# Patient Record
Sex: Female | Born: 1965 | Race: Black or African American | Hispanic: No | Marital: Single | Smoking: Current every day smoker
Health system: Southern US, Community
[De-identification: ages and names within clinical notes are randomized; demographics above are authoritative.]

---

## 2001-01-17 ENCOUNTER — Inpatient Hospital Stay (HOSPITAL_COMMUNITY): Admission: AD | Admit: 2001-01-17 | Discharge: 2001-01-19 | Payer: Self-pay | Admitting: Obstetrics and Gynecology

## 2002-08-22 ENCOUNTER — Inpatient Hospital Stay (HOSPITAL_COMMUNITY): Admission: AD | Admit: 2002-08-22 | Discharge: 2002-08-25 | Payer: Self-pay | Admitting: Obstetrics and Gynecology

## 2002-08-24 ENCOUNTER — Encounter: Payer: Self-pay | Admitting: *Deleted

## 2002-09-28 ENCOUNTER — Inpatient Hospital Stay (HOSPITAL_COMMUNITY): Admission: AD | Admit: 2002-09-28 | Discharge: 2002-09-30 | Payer: Self-pay | Admitting: *Deleted

## 2003-03-24 ENCOUNTER — Encounter: Payer: Self-pay | Admitting: Emergency Medicine

## 2003-03-24 ENCOUNTER — Emergency Department (HOSPITAL_COMMUNITY): Admission: EM | Admit: 2003-03-24 | Discharge: 2003-03-24 | Payer: Self-pay | Admitting: Emergency Medicine

## 2004-08-17 ENCOUNTER — Inpatient Hospital Stay (HOSPITAL_COMMUNITY): Admission: RE | Admit: 2004-08-17 | Discharge: 2004-08-19 | Payer: Self-pay | Admitting: *Deleted

## 2006-04-20 ENCOUNTER — Inpatient Hospital Stay (HOSPITAL_COMMUNITY): Admission: AD | Admit: 2006-04-20 | Discharge: 2006-04-23 | Payer: Self-pay | Admitting: Specialist

## 2007-11-02 ENCOUNTER — Inpatient Hospital Stay (HOSPITAL_COMMUNITY): Admission: AD | Admit: 2007-11-02 | Discharge: 2007-11-03 | Payer: Self-pay | Admitting: Obstetrics and Gynecology

## 2007-11-02 ENCOUNTER — Encounter: Payer: Self-pay | Admitting: Obstetrics and Gynecology

## 2007-11-02 ENCOUNTER — Ambulatory Visit: Payer: Self-pay | Admitting: *Deleted

## 2010-11-11 ENCOUNTER — Emergency Department (HOSPITAL_COMMUNITY)
Admission: EM | Admit: 2010-11-11 | Discharge: 2010-11-11 | Disposition: A | Discharge status: Home / Self Care | Payer: No Typology Code available for payment source | Attending: Emergency Medicine | Admitting: Emergency Medicine

## 2010-11-11 ENCOUNTER — Emergency Department (HOSPITAL_COMMUNITY): Payer: No Typology Code available for payment source

## 2010-11-11 DIAGNOSIS — M542 Cervicalgia: Secondary | ICD-10-CM | POA: Insufficient documentation

## 2010-11-11 DIAGNOSIS — IMO0002 Reserved for concepts with insufficient information to code with codable children: Secondary | ICD-10-CM | POA: Insufficient documentation

## 2011-02-02 NOTE — Discharge Summary (Signed)
NAME:  Rhonda Hansen, Rhonda Hansen                       ACCOUNT NO.:  [id number]   MEDICAL RECORD NO.:  [id number]                   PATIENT TYPE:  INP   LOCATION:  A418                                 FACILITY:  [facility]   PHYSICIAN:  Langley Gauss, M.D.                DATE OF BIRTH:  12/15/1965   DATE OF ADMISSION:  08/22/2002  DATE OF DISCHARGE:  08/25/2002                                 DISCHARGE SUMMARY   DIAGNOSES:  1. Inadequate prenatal care with no prenatal visits to date.  2. Premature cervical dilatation at 32-1/[redacted] weeks gestation.  3. Substance abuse with positive drug screen for cocaine at time of     admission.   DISPOSITION:  The patient is to follow up in the office in one weeks' time  for continued prenatal care.   PERTINENT DISCHARGE MEDICATIONS:  The patient is given a prescription for  prenatal vitamins, likewise Hemocyte-F one p.o. q.d. for iron deficiency  anemia.   PERTINENT LABORATORY STUDIES:  Positive cocaine screen.  Hepatitis is  negative.  Rubella immune.  HIV is negative.   HOSPITAL COURSE:  This is a 45 year old patient who presents to [facility] complaining of abdominal pain.  She presented on August 22, 2002,  was essentially an OB unassigned patient.  The patient was noted to be  having very mild irregular uterine contractions with very few visible on the  monitor.  However, she was noted to have premature dilatation with cervix  known to be 2 cm dilated.  Thus, the patient was observed very carefully on  external fetal monitor observing for any presence of uterine activity.  No  significant uterine activity was encountered.  However, the patient was  admitted for careful observation.  The patient did receive two doses of 12  mg of IM betamethasone during his hospitalization to enhance fetal lung  maturity.  Likewise, she was treated with Unasyn 3 grams IV q.6 h. which  would also cover GC and Chlamydia if they were noted to be active  infections.  The patient did have one episode of nausea and vomiting which  was treated with 25 mg of IM Phenergan.  After initial evaluation on  August 22, 2002, the patient was continued on external fetal monitor.  She  did not reveal any presence of significant uterine activity.  The cervix was  reexamined and noted to be unchanged.  Thus, the patient was continued  during her hospitalization, OB ultrasound was obtained on August 24, 2002  with diagnoses of about 32-1/2 gestation and viable intrauterine pregnancy,  vertex presentation.  The patient's hospitalization was continued due to her  premature cervical dilatation.  On August 25, 2002, the patient did well  with modified bed rest.  She had increased her activity with no further  uterine contraction, no complaints of abdominal pain, cervix was again  examined and noted to be unchanged.  Thus, the  patient is discharged to home  on August 25, 2002.                                               Langley Gauss, M.D.    DC/MEDQ  D:  09/08/2002  T:  09/08/2002  Job:  [id number]

## 2011-02-02 NOTE — Op Note (Signed)
NAME:  Rhonda Hansen, Rhonda Hansen             ACCOUNT NO.:  [id number]   MEDICAL RECORD NO.:  [id number]          PATIENT TYPE:  INP   LOCATION:  A401                          FACILITY:  [facility]   PHYSICIAN:  Richardean Canal, M.D.  DATE OF BIRTH:  December 08, 1965   DATE OF PROCEDURE:  04/20/2006  DATE OF DISCHARGE:                                 OPERATIVE REPORT   DELIVERY NOTE:  Labor began at home at 0700 hours.  The patient arrived at 7  cm dilated in active labor.  The patient reached full dilatation at 1902 and  had a 42-minute second stage of labor.  The second stage was augmented with  Pitocin 2 mU per minute increased to 4 mU per minute to effect three-minute  contractions and facilitate pushing.  At 1944 the patient was delivered of a  normal-appearing female infant, Apgars 9 and 9.  Newborn was placed on the  mother's abdomen where the cord was clamped and divided.  The airway was  suctioned prior to first breath.  Vigorous drying was begun.  The newborn  was moved to the warmer.  Cord blood was collected.  The uterus was firmed  up with manual massage and the placenta delivered and inspected.  The  placenta appeared intact and normal.  Birth canal was inspected for  lacerations and none were found.  Estimated blood loss was less than 500 mL.  The patient tolerated the procedure well and remained in the birthing room  in satisfactory postpartum condition.           ______________________________  Richardean Canal, M.D.     RW/MEDQ  D:  04/20/2006  T:  04/21/2006  Job:  [id number]

## 2011-02-02 NOTE — Op Note (Signed)
NAME:  Rhonda Hansen, Rhonda Hansen                       ACCOUNT NO.:  [id number]   MEDICAL RECORD NO.:  [id number]                   PATIENT TYPE:  INP   LOCATION:  A417                                 FACILITY:  [facility]   PHYSICIAN:  Langley Gauss, M.D.                DATE OF BIRTH:  Jan 29, 1966   DATE OF PROCEDURE:  09/28/2002  DATE OF DISCHARGE:                                 OPERATIVE REPORT   DIAGNOSES:  1. 39-week intrauterine pregnancy for induction of labor.  2. History of substance abuse, specifically cocaine, during the pregnancy.   DELIVERY FORM:  Spontaneous assisted vaginal delivery of a 6 lb, 1 oz, female  infant, delivered over an intact perineum.  Delivery performed by Dr. Roylene Reason. Lisette Grinder.   ESTIMATED BLOOD LOSS:  Less than 500 cc.   COMPLICATIONS:  None.   SPECIMENS:  Arterial cord gas and cord blood to pathology and laboratory.  The placenta was examined and was noted to be apparently intact with a three-  vessel umbilical cord.   SUMMARY:  The patient is a 45 year old gravida 4, para 3, at [redacted] weeks  gestation and is admitted for induction.  Pertinently, the patient was  advised of the risks about cocaine use during the pregnancy.  A urine drug  screen at the time of admission for induction was noted to be negative.  On  initial examination, the patient was noted to be 3 cm dilated with a  reassuring fetal heart rate.  Amniotomy was performed with the findings of  clear amniotic fluid.  A fetal scalp electrode was placed, which documented  a reassuring fetal heart rate.  With the onset of discomfort and uterine  contractions, the patient requested epidural analgesia.  She was noted to  progress very rapidly __________ cm dilatation.  Examination at that time  revealed the apex to be well-applied to the __________ with excellent  descent with uterine contraction.  Thus, it was felt there was not adequate  time to place an epidural.  The patient was thus treated with IV  Nubain and  Phenergan during the course of labor.  She continued to progress very  rapidly.  She was placed in a dorsolithotomy position and prepped and draped  in the usual sterile manner.  The patient pushed well during a very short  second-stage labor.  The infant delivered in a direct OA position over the  intact perineum.  The mouth and nares were bulb suctioned of clear amniotic  fluid, and with expulsive efforts resulted spontaneous rotation to a left  anterior shoulder position.  Then, gentle downward traction combined with  good expulsive efforts resulted in the delivery of this infant beneath the  pubis symphysis without difficulty.  Spontaneous vigorous breathe and cry  was noted.  The umbilical cord was then milked toward the infant.  The cord  was doubly clamped and cut, and the infant was managed  by the nursing staff.  Arterial cord gas and cord blood were then obtained.  Gentle traction on the  umbilical cord resulted in separation which upon examination what appears to  be an intact placenta  with an attached three-vessel umbilical cord.  Excellent uterine tone was  achieved following delivery with IV Pitocin solution.  Examination of the  genital tract revealed no laceration.  Both mother and infant were doing  well following delivery.                                               Langley Gauss, M.D.    DC/MEDQ  D:  09/29/2002  T:  09/30/2002  Job:  [id number]

## 2011-02-02 NOTE — Discharge Summary (Signed)
NAME:  Elden, Signe             ACCOUNT NO.:  [id number]   MEDICAL RECORD NO.:  [id number]          PATIENT TYPE:  INP   LOCATION:  A401                          FACILITY:  [facility]   PHYSICIAN:  Tilda Burrow, M.D. DATE OF BIRTH:  Feb 01, 1966   DATE OF ADMISSION:  04/20/2006  DATE OF DISCHARGE:  LH                                 DISCHARGE SUMMARY   I came to labor and delivery to see Ms. Gadway twice today, however on  both occasions she was out smoking, however she would be gone for 45  minutes to an hour each time with no explanation for her absence.  [employer] came and took custody of her baby this  afternoon, and she left against medical advice.  The nurse, Donzetta Sprung,  saw her leaving on the security cameras, and just prior to that had told her  that I was here to see her to discharge her, and she actually went out to  talk with Ms. Karl Ito, however, Ms. Salinas said she was not coming back  into the birthing center, and left so no formal discharge was done.  Ms.  Broadus John told her to keep her follow up appointments as made, and she  verbalized an understanding.      Jacklyn Shell, C.N.M.      Tilda Burrow, M.D.  Electronically Signed    FC/MEDQ  D:  04/23/2006  T:  04/24/2006  Job:  [id number]   cc:   [facility]

## 2011-02-02 NOTE — H&P (Signed)
NAME:  Rhonda Hansen, Rhonda Hansen                       ACCOUNT NO.:  [id number]   MEDICAL RECORD NO.:  [id number]                   PATIENT TYPE:  INP   LOCATION:  A417                                 FACILITY:  [facility]   PHYSICIAN:  Langley Gauss, M.D.                DATE OF BIRTH:  04-Feb-1966   DATE OF ADMISSION:  09/28/2002  DATE OF DISCHARGE:                                HISTORY & PHYSICAL   HISTORY OF PRESENT ILLNESS:  This is a 45 year old gravida 4, para 3 at [redacted]  weeks gestation who is admitted for induction of labor.  The patient's  prenatal course has been complicated by very late presentation for OB care.  The patient presented to [facility] at 32-1/[redacted] weeks gestation with  premature cervical dilatation, premature labor, and noted to be positive  cocaine screen at that time.  The patient was admitted and treated with IV  fluids.  Also received empiric Unasyn 3 g Iv q.6h.  Received two injections  of IM betamethasone to enhance fetal lung maturity.  OB ultrasound performed  at Department of Radiology revealed a 32-1/2 week intrauterine pregnancy  with vertex presentation and normal anatomic survey as best could be  ascertained at [redacted] weeks gestation.  The patient did well at bed rest during  this hospitalization.  She did not require any other tocolytic therapy and  was discharged to home after a three day hospital stay.  The patient chose  to continue prenatal care through our office.  She has been followed since  that point in time, been very aggressive in our discussion with her of the  importance of cessation of cocaine use of the pregnancy and she is very  aware of the detrimental effects that this can have on the pregnancy, most  pertinently at that time of scheduling for the induction of labor patient is  advised that a urine drug screen will be performed upon admission on that  date and induction performed only if urine drug screen is negative.  The  patient has had  several nonstress tests which have been reactive.   PAST MEDICAL HISTORY:  She does have three prior vaginal deliveries without  complications, 45 years old, 45 years old, and 18 months.  Each of these  weighed 5+ pounds.   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  We did not have an  opportunity to perform a hemoglobin electrophoresis due to patient's  sporadic prenatal care.  However, she has no indication that she does have  sickle cell disease, thus most likely she is a carrier only and this can be  evaluated on review of her previous medical record.   ALLERGIES:  The patient has no known drug allergies.   CURRENT MEDICATIONS:  Prenatal vitamins.   SOCIAL HISTORY:  Smokes five to six cigarettes per day.  Hopefully negative  cocaine.   PHYSICAL EXAMINATION:  GENERAL:  Black female.  VITAL SIGNS:  Height 5 feet 4 inches, prepregnancy weight 110, most recent  weight 132, blood pressure 136/74, pulse rate 80, respiratory rate 20.  HEENT:  Negative.  No adenopathy.  NECK:  Supple.  Thyroid is nonpalpable.  Mucous membranes are moist.  LUNGS:  Clear.  CARDIOVASCULAR:  Regular rate and rhythm.  ABDOMEN:  Soft and nontender.  No surgical scars are identified.  The  patient is vertex presentation by Leopold's maneuvers.  Fundal height is  noted to be 36 cm.  EXTREMITIES:  Normal.  PELVIC:  Normal external genitalia.  No lesions or ulcerations identified.  No leakage of fluid or vaginal bleeding.  Cervix noted to be 3 cm dilated,  70% effaced, -1 station with the vertex well applied to the cervix.  External fetal monitor reveals a reassuring fetal heart rate with the fetal  heart rate baseline of 140-150.  Fetal heart rate accelerations are noted,  but no fetal heart rate decelerations.   ASSESSMENT:  A 39 week intrauterine pregnancy, history of cocaine use during  this pregnancy with very late prenatal care.  Urine drug screen obtained at  time of admission is negative  for cocaine.   PLAN:  Proceed with an amniotomy and assess uterine contraction pattern,  thereafter to augment or induce with Pitocin as clinically indicated.  The  patient does plan on bottle feeding.                                               Langley Gauss, M.D.    DC/MEDQ  D:  09/28/2002  T:  09/28/2002  Job:  [id number]

## 2011-02-02 NOTE — Group Therapy Note (Signed)
NAME:  Rhonda Hansen, Rhonda Hansen             ACCOUNT NO.:  [id number]   MEDICAL RECORD NO.:  [id number]          PATIENT TYPE:  INP   LOCATION:  LDR1                          FACILITY:  [facility]   PHYSICIAN:  Lazaro Arms, M.D.   DATE OF BIRTH:  December 07, 1965   DATE OF PROCEDURE:  DATE OF DISCHARGE:                                   PROGRESS NOTE   DELIVERY SUMMARY:   ONSET OF LABOR:  August 16, 2004 at 8:30 p.m.   DATE OF DELIVERY:  August 17, 2004 at 2:33 a.m.   LENGTH OF FIRST STAGE LABOR:  Four hours and 29 minutes.   LENGTH OF SECOND STAGE LABOR:  33 minutes.   LENGTH OF THIRD STAGE LABOR:  7 minutes.   Hannie had a vacuum-assisted delivery.  Kiwi vacuum was placed on the  presenting head at a crowning station due to maternal inability to push.  Kiwi suction was applied to the green marker and assisted times one  contraction.  Head was easily delivered.  Mucosal thickening was noted.  On  delivery of head, infant was thoroughly suctioned with the DeLee suction on  the perineum and then a shoulder cord was noted, but infant spontaneously  delivered.  Upon delivery of infant, infant was again DeLee suctioned very  thoroughly; 6 cc of yellowish meconium was obtained in the DeLee.  Infant  had spontaneous cry, good movement, pinked up well.  Cord was clamped and  cut and passed off to the nursery staff for care.  Apgar's were 9 and 9.  Third stage of labor was actively managed with 20 units Pitocin and 1000 cc  of LR at a rapid rate.  Placenta was delivered spontaneously via Schultz's  mechanism.  A three-vessel cord was noted upon inspection.  Membranes were  noted to be intact, but stained yellow with meconium.  Placenta was sent to  pathology.  Infant and mother were stabilized, transferred out to the  postpartum unit in stable condition.     ____________________  Lazaro Arms, M.D.     Darl   DL/MEDQ  D:  57/84/6962  T:  08/17/2004  Job:  [id number]   cc:   Francoise Schaumann. Halm,  D.O.  [address], [address]  [place]  [place] [address]  Fax: [phone]   [facility]

## 2011-02-02 NOTE — Discharge Summary (Signed)
   NAME:  Rhonda Hansen, Rhonda Hansen                       ACCOUNT NO.:  [id number]   MEDICAL RECORD NO.:  [id number]                   PATIENT TYPE:  INP   LOCATION:  A417                                 FACILITY:  [facility]   PHYSICIAN:  Langley Gauss, M.D.                DATE OF BIRTH:  02/19/1966   DATE OF ADMISSION:  09/28/2002  DATE OF DISCHARGE:  09/30/2002                                 DISCHARGE SUMMARY   DISCHARGE DIAGNOSES:  1. A 39 week intrauterine pregnancy for induction of labor.  2. History of substance abuse, specifically cocaine use during this     pregnancy.  3. Insufficient prenatal care with very late presentation to labor and     delivery as an unassigned obstetrical patient.   HISTORY OF PRESENT ILLNESS:  Delivery performed:  A spontaneous assisted  vaginal delivery of a 6 pound 1 ounce female infant delivered over an intact  perineum.  Apparently an infant circumcision was not performed.  No epidural  was performed.   LABORATORY DATA:  Urine drug screen was negative upon admission.  The  patient was noted to have a positive cocaine toxicology on 08/22/02.  O  positive blood type.  RPR was nonreactive.  Hemoglobin 10.7, hematocrit  31.4, with a white count of 6.0.   HOSPITAL COURSE:  See previous dictation.  The patient was admitted on  09/28/02.  Amniotomy performed.  Clear amniotic fluid was noted.  Fetal scalp  electrode documented reassuring fetal heart rate.  The patient received only  IV Nubain during the course of labor.  There was insufficient time to place  an epidural secondary to a rapid progress of labor and delivery.  Thus the  patient progressed rapidly to a well controlled atraumatic vaginal delivery.  Post partum the patient did very well.  She bonded well with the infant.  She had good support from friends.  She remained afebrile and had no  excessive vaginal bleeding.  Thus the patient and the infant were discharged  to home on post partum day #1-1/2.  As  stated previously the patient did not  meet her financial obligation for circumcision, thus it was not performed at  the time of discharge.  The patient is notified that this can be performed  on an outpatient basis in the office if she desires.                                               Langley Gauss, M.D.    DC/MEDQ  D:  10/06/2002  T:  10/06/2002  Job:  [id number]

## 2011-02-02 NOTE — H&P (Signed)
NAME:  Rhonda Hansen, [name]                       ACCOUNT NO.:  [id number]   MEDICAL RECORD NO.:  [id number]                   PATIENT TYPE:  INP   LOCATION:  A418                                 FACILITY:  [facility]   PHYSICIAN:  Langley Gauss, M.D.                DATE OF BIRTH:  1966-03-28   DATE OF ADMISSION:  08/22/2002  DATE OF DISCHARGE:                                HISTORY & PHYSICAL   SUBJECTIVE:  The patient states significant relief from pelvic pressure.  She denies any uterine contractions or menstrual-type cramps, likewise she  denies any intermittent back pain.  She specifically denies any vaginal  discharge, leakage of fluid, or any vaginal bleeding.  In addition, mucousy  discharge has not increased.   OBJECTIVE:  GENERAL:  The patient appears in no acute distress.  Respiratory  symptoms are markedly improved as the patient is not noted to have the  sporadic coughing episodes.  VITAL SIGNS:  Blood pressure noted to be 120-130/80-100, respiratory rate  stable at 20-24, and pulse less than 100.   PHYSICAL EXAMINATION:  GENERAL:  Noted to be in no acute distress.  Ambulatory, cooperative.  LUNGS:  Clear to auscultation.  CARDIOVASCULAR:  Reveals regular rate and rhythm.  PELVIC:  The uterus remains soft and nontender with fundal height of 29 cm  and continues to be vertex presentation.  EXTREMITIES:  Normal.  PELVIC:  There is noted to be no leakage fluid or vaginal bleeding.   LABORATORY DATA:  Laboratories discussed with the patient to include the  positive cocaine, which she is strongly advised has deleterious effects on  the pregnancy, to consist of hypertensive crisis, possible stroke, also as  an etiology of her preterm labor and premature cervical dilatation.  She is  likewise advised that this will compromise fetal growth and development.  Nonstress test performed on August 23, 2002, reveals fetal heart rate 150,  accelerations noted greater than 15 beats per  minute times greater than 15  second duration, no fetal heart rate decelerations are noted, long-term  variability is noted to be normal.  External fetal monitor interpreted at  this time now reveals the complete absence of any significant uterine  activity.   ASSESSMENT:  Best gestational age of about [redacted] weeks gestation with no  prenatal care, presenting with premature cervical dilatation and substance  abuse problem with positive cocaine on urine drug screen.  The patient did  not require any aggressive tocolysis as no significant uterine contractions  were identified by history or by external fetal monitor.   PLAN:  The patient is currently to be treated with betamethasone x2 to  enhance fetal lung maturity.  In addition, she is achieving IV Unasyn  empirically for the upper respiratory infection with productive cough.  In  addition, this will provide any coverage for her increased risk of group B  streptococcus carrier status.  The patient will be  confined to modified  bedrest.  Foley catheter is currently in place to document the adequacy of  urine output.  The patient will be continued during this hospitalization and  frequently reassessed for any preterm labor.  An OB ultrasound will be  performed in the Department of Radiology on August 24, 2002, to perform an  anatomic survey.   ASSESSMENT:  1. On August 23, 2002, nonstress test interpretation, 32 weeks premature     cervical dilatation.  2. Pelvic pain.  3. Substance abuse, cocaine.                                               Langley Gauss, M.D.    DC/MEDQ  D:  08/24/2002  T:  08/24/2002  Job:  [id number]

## 2011-02-02 NOTE — H&P (Signed)
NAME:  Rhonda Hansen, Rhonda Hansen             ACCOUNT NO.:  [id number]   MEDICAL RECORD NO.:  [id number]          PATIENT TYPE:  INP   LOCATION:  LDR1                          FACILITY:  [facility]   PHYSICIAN:  Richardean Canal, M.D.  DATE OF BIRTH:  [birth year]   DATE OF ADMISSION:  04/20/2006  DATE OF DISCHARGE:  LH                                HISTORY & PHYSICAL   HISTORY:  This is a 45 year old black female, gravida 6, para 5, with  unknown EDC, who came in with no prenatal care in labor.  The history is  sketchy.  The patient gives a history of drug use, but states none in the  past week or two.  The monitor strip shows reduced variability suggesting  the possibility of more recent drug use.  A drug screen has been drawn.  The  patient was evaluated on admission and found to be 7 cm, in active labor.  The patient is unknown Group B Strep status.   PAST MEDICAL HISTORY:  Negative.   PAST SURGICAL HISTORY:  None.   ALLERGIES:  No known drug allergies.   REGULAR MEDICATIONS:  None.   PHYSICAL EXAMINATION:  GENERAL:  The patient appears as a well-developed,  well-nourished, thin, term or near-term black female in advanced labor.  HEART:  Normal sinus rhythm.  No murmurs are heard.  LUNGS:  Clear.  ABDOMEN:  The fetus is extending vertex with the head deeply engaged.  There  are no surgical scars on the abdomen.  The uterus is soft and nontender  between contractions.  The fundus appears small term size.  EXTREMITIES:  There is no peripheral edema.  Reflexes are 1+.  PELVIC:  The cervix is completely dilated with membranes intact and the  fetus presenting as an LOA at +3 station.   IMPRESSION:  1.  Intrauterine gestation at or near term.  2.  Active labor.  3.  No prenatal care  4.  Unknown Group B Strep status.   DISPOSITION:  An IV has been started and the patient is receiving  ampicillin.  Membranes have been ruptured artificially with a slight tinge  of meconium, but basically clear  fluid.  Management is expectant for vaginal  delivery.                                            ______________________________  Richardean Canal, M.D.     RW/MEDQ  D:  04/20/2006  T:  04/20/2006  Job:  [id number]

## 2011-02-02 NOTE — H&P (Signed)
NAME:  Rhonda Hansen, Rhonda Hansen                       ACCOUNT NO.:  [id number]   MEDICAL RECORD NO.:  [id number]                   PATIENT TYPE:  INP   LOCATION:  A418                                 FACILITY:  [facility]   PHYSICIAN:  Langley Gauss, M.D.                DATE OF BIRTH:  1965/10/26   DATE OF ADMISSION:  08/22/2002  DATE OF DISCHARGE:                                HISTORY & PHYSICAL   HISTORY OF PRESENT ILLNESS:  This is a 45 year old gravida 4, para 3, with  an unknown last menstrual period and no prenatal care to date, who presents  to [facility] the late p.m. of 08/22/02 with the chief complaint of  vomiting x3 days duration, unable to tolerate any p.o. intake, and some  pelvic pressure and pain.  The patient also complains of a nonproductive  cough of several days duration.  Pertinently, the patient states that she  has had the onset of uterine cramps the last two evenings.  She states the  cramps intensified in the nighttime as they currently are.  The patient  does, however, provide history that the only reason she came today, rather  than 24 or 48 hours previously, was due to transportation.  The patient  denies any vaginal bleeding or leakage of fluid.   PAST OBSTETRIC HISTORY:  The patient has three prior vaginal deliveries at  term without complications, by her report.  Most recently she delivered May  2003.   ALLERGIES:  She states no known drug allergies.   CURRENT MEDICATIONS:  None.   The patient did deny substance use during the pregnancy.   PHYSICAL EXAMINATION:  Height and weight are unknown.  GENERAL:  The patient appears to be in mild distress, complaining of pelvic  pressure, and also is noted to have very frequent, nonproductive, raspy  cough.  VITAL SIGNS:  Blood pressure 137/100, pulse rate of 68, respiratory rate is  20, temperature 97.8.  HEENT:  Reveals slightly icteric sclerae.  NECK:  Supple.  NEURO:  Cranial nerves are intact.  LUNGS:  Clear to auscultation.  CARDIOVASCULAR:  Regular rate and rhythm, no murmurs identified.  ABDOMEN:  Uterus noted to be soft, nontender.  No surgical scars are  identified.  Fundal height was measured by the nursing staff and the report  was that it was 23-24 cm.  Fundal height, performed by myself, revealed a  fundal height of 29 cm.  She is noted to be vertex presentation by Leopold's  maneuvers.  EXTREMITIES:  Noted to be normal.  PELVIC:  Reveals normal external genitalia.  No lesions or ulcerations  identified.  No vaginal bleeding.  No leakage of fluid.  Digital examination  reveals the cervix to be 2 cm dilated, 70% effaced.  Vertex at a -1 station  and easily palpable, noted to be still posterior.   STUDIES:  External fetal monitor and stress test interpretation:  The  external fetal monitor reveals a fetal heart rate baseline of 150.  There  are accelerations noted at greater than 15 beats per minute times greater  than 15 second duration.  No fetal heart rate decelerations are noted.  Long-  term variability is noted to be normal.  Toco externally is noninterpretable  secondary to the patient's frequent episodes of nonproductive coughing.  Nonstress test interpretation:  Reactive.  A limited OB ultrasound performed  by Dr. Roylene Reason. Lisette Grinder in labor and delivery reveals a single intrauterine  pregnancy, vertex presentation, subjectively a normal amniotic fluid volume  is noted, fetal cardiac activity is identified within the 150s.  Fetal  movement is likewise noted.  The placenta is noted to be not low lying.  Multiple parameters and views performed of the BPD and femur length reveals  parameters consistent with 32-[redacted] weeks gestation.  Anatomic survey is not  performed.   LABORATORY DATA:  Pertinent laboratory studies obtained in this patient with  no prenatal care reveals evidence of iron-deficiency anemia with a  hemoglobin of 9.9, hematocrit 29.1, platelet count is  220,000.  Electrolytes  reveal electrolyte disturbance with hypokalemia with a potassium of 3.3.  Urinalysis is pertinent for greater than 80 ketones, small esterase present,  few bacteria present, few epithelial cells present.  O-positive blood type.  RPR, hepatitis, and HIV currently pending.  Urinalysis is also pertinent for  a urine drug screen, done according to protocol, for any patient presenting  with pre-term labor or no prenatal care.  The urine drug screen is positive  for cocaine.   ASSESSMENT:  1. Unknown gestational age, multiple parameters will place the patient at 53-     [redacted] weeks gestation, per ultrasound by Dr. Lisette Grinder.  Vertex presentation.  2. Positive cocaine on the urine drug screen which is discussed with the     patient, though she denies it to be an ongoing active problem.  3. No prenatal care.  All prenatal laboratory studies to be obtained during     this hospitalization.  4. Iron-deficiency anemia.  Hopefully, this will be corrected with the     initiation of prenatal vitamins and iron therapy.  5. Hypokalemia.  The patient will receive aggressive fluid hydration during     this hospitalization, which in addition, should result in clearing of her     ketonuria.  6. Premature dilatation with cervix noted to be 2 cm dilated in this     multiparous patient, unable to interpret at this time whether uterine     contractions are responsible for this dilatation.  7. Upper respectively infection.  Patient with nonproductive hacking cough     and is a cigarette smoker with presumed bronchitis.  She will be treated     with IV Unasyn during this hospitalization, as well as Tussionex as a     cough suppressant and expectorant.  Hopefully, this will result in     decreased coughing episodes which will result in being able to ascertain     the presence or absence of significant uterine activity, in addition,    serial pelvic examinations will be required to assess for any  cervical     change.  The patient is to be aggressively tocolysed should cervical     change for uterine contractions be identified.  The patient will be     treated with betamethasone 12 mg IM which can be repeated in 24 hours     duration  to enhance fetal lung maturity due to her premature dilatation.                                               Langley Gauss, M.D.    DC/MEDQ  D:  08/24/2002  T:  08/24/2002  Job:  [id number]

## 2011-02-02 NOTE — H&P (Signed)
NAME:  Maring, Lorre             ACCOUNT NO.:  [id number]   MEDICAL RECORD NO.:  [id number]          PATIENT TYPE:  OIB   LOCATION:  LDR1                          FACILITY:  [facility]   PHYSICIAN:  Lazaro Arms, M.D.   DATE OF BIRTH:  Feb 21, 1966   DATE OF ADMISSION:  08/17/2004  DATE OF DISCHARGE:  LH                                HISTORY & PHYSICAL   REASON FOR ADMISSION:  Pregnancy at approximately 34 to 35 weeks, active  labor.   HISTORY OF PRESENT ILLNESS:  Ms. Alvidrez states that she started having  contractions at approximately 8:30 p.m. tonight.  She has not had any  prenatal care.  This is her fifth pregnancy, fifth baby.  She is a gravida  5, para 4.  Admitted in active labor.  Also, she states she does drink.  She  also uses crack cocaine.  The last time she used was approximately 4-5 days  ago.   MEDICAL HISTORY:  Negative.   SURGICAL HISTORY:  Negative.   No prenatal care.   PHYSICAL EXAMINATION:  VITAL SIGNS:  Stable.  ABDOMEN:  Fundal height is 34-35 cm.  PELVIC:  Cervix presentation is noted.  Cervix is 7 cm, 80% effaced, -1  station.  Fetal heart rate pattern is stable with accelerations noted.   PLAN:  We are going to admit.  Expect vaginal delivery.  Start ampicillin 2  gm IV for GBS prophylaxis due to unknown GBS status.  Also will get a social  service consult.     Darl   DL/MEDQ  D:  16/06/9603  T:  08/17/2004  Job:  [id number]   cc:   [facility]

## 2011-02-02 NOTE — Discharge Summary (Signed)
NAME:  Rhonda Hansen, Rhonda Hansen             ACCOUNT NO.:  [id number]   MEDICAL RECORD NO.:  [id number]          PATIENT TYPE:  INP   LOCATION:  A403                          FACILITY:  [facility]   PHYSICIAN:  Tilda Burrow, M.D. DATE OF BIRTH:  24-Aug-1966   DATE OF ADMISSION:  08/17/2004  DATE OF DISCHARGE:  12/03/2005LH                                 DISCHARGE SUMMARY   ADMISSION DIAGNOSES:  1.  Pregnancy at 34 to 35 weeks.  2.  Active labor.  3.  No prenatal care.  4.  History of cocaine use.   HISTORY:  History of diagnosed pregnancy at 54 to [redacted] weeks gestation,  delivered.  History of cocaine use.  Desire for elective permanent  sterilization in 30 days.   HOSPITAL COURSE:  Ms. Hoban was admitted as gravida 5, para 4, in active  labor with a history of alcohol and cocaine use during the pregnancy.  Urine  drug screen on admission is negative.  She is 7 cm, 80, -1, on admission.  With the patient begun on ampicillin due to unknown Group B Strep probe  status.   The patient progressed promptly within a couple of hours to delivery.  Delivering a healthy-appearing female infant, Apgar's 9 and 9, delivered by  Zerita Boers, N.M.  She was kept for two days and discharged on August 19, 2004.  The patient's urine drug screen as reported was negative.   LABORATORY DATA:  Additional labs include GC and Chlamydia negative.  Blood  type O positive. Hemoglobin and hematocrit 11 and 32.  HIV nonreactive.  Hepatitis B surface antigen negative.  Rubella immunity present.  Urine drug  screen showed no evidence of cocaine in her system on admission.  Postpartum  hematocrit was 30%.  She was stable for discharge.  Plans to sign tubal  papers in the office and proceed with permanent sterilization in 30 days.   DISCHARGE MEDICATIONS:  1.  Prenatal vitamins.  2.  Iron.   FOLLOWUP:  Will follow up in our office.     John   JVF/MEDQ  D:  08/19/2004  T:  08/19/2004  Job:  [id number]

## 2011-06-08 LAB — RAPID URINE DRUG SCREEN, HOSP PERFORMED
Barbiturates: NOT DETECTED
Opiates: NOT DETECTED

## 2011-06-08 LAB — STREP B DNA PROBE: Strep Group B Ag: NEGATIVE

## 2011-06-08 LAB — DIFFERENTIAL
Basophils Absolute: 0
Basophils Relative: 0
Eosinophils Absolute: 0.1
Monocytes Absolute: 0.8
Monocytes Relative: 7
Neutro Abs: 9.2 — ABNORMAL HIGH
Neutrophils Relative %: 78 — ABNORMAL HIGH

## 2011-06-08 LAB — TYPE AND SCREEN
ABO/RH(D): O POS
Antibody Screen: NEGATIVE

## 2011-06-08 LAB — ABO/RH: ABO/RH(D): O POS

## 2011-06-08 LAB — CBC
Hemoglobin: 10 — ABNORMAL LOW
MCHC: 35.1
MCV: 93.7
RBC: 3.05 — ABNORMAL LOW
RDW: 13.7

## 2011-06-08 LAB — RPR: RPR Ser Ql: NONREACTIVE

## 2014-03-04 ENCOUNTER — Encounter (HOSPITAL_COMMUNITY): Payer: Self-pay | Admitting: Emergency Medicine

## 2014-03-04 ENCOUNTER — Emergency Department (HOSPITAL_COMMUNITY)
Admission: EM | Admit: 2014-03-04 | Discharge: 2014-03-04 | Disposition: A | Discharge status: Home / Self Care | Payer: No Typology Code available for payment source | Attending: Emergency Medicine | Admitting: Emergency Medicine

## 2014-03-04 DIAGNOSIS — S1190XA Unspecified open wound of unspecified part of neck, initial encounter: Secondary | ICD-10-CM | POA: Insufficient documentation

## 2014-03-04 DIAGNOSIS — Z23 Encounter for immunization: Secondary | ICD-10-CM | POA: Insufficient documentation

## 2014-03-04 DIAGNOSIS — S1191XA Laceration without foreign body of unspecified part of neck, initial encounter: Secondary | ICD-10-CM

## 2014-03-04 DIAGNOSIS — F17200 Nicotine dependence, unspecified, uncomplicated: Secondary | ICD-10-CM | POA: Insufficient documentation

## 2014-03-04 MED ORDER — LIDOCAINE HCL (PF) 1 % IJ SOLN
5.0000 mL | Freq: Once | INTRAMUSCULAR | Status: DC
  Filled 2014-03-04: qty 5

## 2014-03-04 MED ORDER — TETANUS-DIPHTH-ACELL PERTUSSIS 5-2.5-18.5 LF-MCG/0.5 IM SUSP
0.5000 mL | Freq: Once | INTRAMUSCULAR | Status: AC
  Administered 2014-03-04: 0.5 mL via INTRAMUSCULAR
  Filled 2014-03-04: qty 0.5

## 2014-03-04 NOTE — Discharge Instructions (Signed)
Please be sure to monitor your condition carefully.  Return here for concerning changes in your condition.  Sutures need to be removed in 7 days.
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
## 2014-03-04 NOTE — ED Notes (Signed)
EDP in with suture cart
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
## 2014-07-19 ENCOUNTER — Encounter (HOSPITAL_COMMUNITY): Payer: Self-pay | Admitting: Emergency Medicine

## 2017-01-30 ENCOUNTER — Encounter (HOSPITAL_COMMUNITY): Payer: Self-pay | Admitting: *Deleted

## 2017-01-30 ENCOUNTER — Observation Stay (HOSPITAL_COMMUNITY)
Admission: EM | Admit: 2017-01-30 | Discharge: 2017-01-31 | Disposition: A | Discharge status: Home / Self Care | Payer: Self-pay | Attending: Family Medicine | Admitting: Family Medicine

## 2017-01-30 ENCOUNTER — Emergency Department (HOSPITAL_COMMUNITY): Payer: Self-pay

## 2017-01-30 DIAGNOSIS — S51012A Laceration without foreign body of left elbow, initial encounter: Secondary | ICD-10-CM | POA: Insufficient documentation

## 2017-01-30 DIAGNOSIS — Y939 Activity, unspecified: Secondary | ICD-10-CM | POA: Insufficient documentation

## 2017-01-30 DIAGNOSIS — R42 Dizziness and giddiness: Secondary | ICD-10-CM

## 2017-01-30 DIAGNOSIS — Y999 Unspecified external cause status: Secondary | ICD-10-CM | POA: Insufficient documentation

## 2017-01-30 DIAGNOSIS — R2689 Other abnormalities of gait and mobility: Secondary | ICD-10-CM | POA: Insufficient documentation

## 2017-01-30 DIAGNOSIS — Z5181 Encounter for therapeutic drug level monitoring: Secondary | ICD-10-CM | POA: Insufficient documentation

## 2017-01-30 DIAGNOSIS — S1191XA Laceration without foreign body of unspecified part of neck, initial encounter: Secondary | ICD-10-CM | POA: Insufficient documentation

## 2017-01-30 DIAGNOSIS — S0101XA Laceration without foreign body of scalp, initial encounter: Principal | ICD-10-CM | POA: Insufficient documentation

## 2017-01-30 DIAGNOSIS — R2681 Unsteadiness on feet: Secondary | ICD-10-CM

## 2017-01-30 DIAGNOSIS — F17210 Nicotine dependence, cigarettes, uncomplicated: Secondary | ICD-10-CM | POA: Insufficient documentation

## 2017-01-30 DIAGNOSIS — Y929 Unspecified place or not applicable: Secondary | ICD-10-CM | POA: Insufficient documentation

## 2017-01-30 DIAGNOSIS — S21212A Laceration without foreign body of left back wall of thorax without penetration into thoracic cavity, initial encounter: Secondary | ICD-10-CM | POA: Insufficient documentation

## 2017-01-30 DIAGNOSIS — S0990XA Unspecified injury of head, initial encounter: Secondary | ICD-10-CM

## 2017-01-30 DIAGNOSIS — S41012A Laceration without foreign body of left shoulder, initial encounter: Secondary | ICD-10-CM | POA: Insufficient documentation

## 2017-01-30 LAB — RAPID URINE DRUG SCREEN, HOSP PERFORMED
AMPHETAMINES: NOT DETECTED
BARBITURATES: NOT DETECTED
Benzodiazepines: NOT DETECTED
Cocaine: POSITIVE — AB
Opiates: POSITIVE — AB
TETRAHYDROCANNABINOL: NOT DETECTED

## 2017-01-30 LAB — CBC WITH DIFFERENTIAL/PLATELET
Basophils Absolute: 0.1 10*3/uL (ref 0.0–0.1)
Basophils Relative: 1 %
Eosinophils Absolute: 0 10*3/uL (ref 0.0–0.7)
Eosinophils Relative: 1 %
HEMATOCRIT: 33.3 % — AB (ref 36.0–46.0)
Hemoglobin: 11.6 g/dL — ABNORMAL LOW (ref 12.0–15.0)
LYMPHS PCT: 24 %
Lymphs Abs: 1.5 10*3/uL (ref 0.7–4.0)
MCH: 32.5 pg (ref 26.0–34.0)
MCHC: 34.8 g/dL (ref 30.0–36.0)
MCV: 93.3 fL (ref 78.0–100.0)
MONO ABS: 0.5 10*3/uL (ref 0.1–1.0)
MONOS PCT: 8 %
NEUTROS ABS: 4.2 10*3/uL (ref 1.7–7.7)
Neutrophils Relative %: 66 %
Platelets: 231 10*3/uL (ref 150–400)
RBC: 3.57 MIL/uL — ABNORMAL LOW (ref 3.87–5.11)
RDW: 13.2 % (ref 11.5–15.5)
WBC: 6.4 10*3/uL (ref 4.0–10.5)

## 2017-01-30 LAB — TYPE AND SCREEN
ABO/RH(D): O POS
Antibody Screen: NEGATIVE

## 2017-01-30 LAB — BASIC METABOLIC PANEL
ANION GAP: 11 (ref 5–15)
BUN: 11 mg/dL (ref 6–20)
CO2: 19 mmol/L — AB (ref 22–32)
Calcium: 8.3 mg/dL — ABNORMAL LOW (ref 8.9–10.3)
Chloride: 106 mmol/L (ref 101–111)
Creatinine, Ser: 0.87 mg/dL (ref 0.44–1.00)
GFR calc Af Amer: >60 mL/min (ref >60)
GFR calc non Af Amer: >60 mL/min (ref >60)
GLUCOSE: 164 mg/dL — AB (ref 65–99)
POTASSIUM: 3.2 mmol/L — AB (ref 3.5–5.1)
Sodium: 136 mmol/L (ref 135–145)

## 2017-01-30 LAB — I-STAT BETA HCG BLOOD, ED (MC, WL, AP ONLY): I-stat hCG, quantitative: <5 m[IU]/mL (ref <5)

## 2017-01-30 LAB — ETHANOL: Alcohol, Ethyl (B): <5 mg/dL (ref <5)

## 2017-01-30 LAB — CBG MONITORING, ED: Glucose-Capillary: 88 mg/dL (ref 65–99)

## 2017-01-30 LAB — HEMOGLOBIN AND HEMATOCRIT, BLOOD
HCT: 27.5 % — ABNORMAL LOW (ref 36.0–46.0)
Hemoglobin: 9.4 g/dL — ABNORMAL LOW (ref 12.0–15.0)

## 2017-01-30 MED ORDER — VITAMIN B-1 100 MG PO TABS
100.0000 mg | ORAL_TABLET | Freq: Every day | ORAL | Status: DC
  Administered 2017-01-30 – 2017-01-31 (×2): 100 mg via ORAL
  Filled 2017-01-30 (×2): qty 1

## 2017-01-30 MED ORDER — LORAZEPAM 1 MG PO TABS
0.0000 mg | ORAL_TABLET | Freq: Four times a day (QID) | ORAL | Status: DC
  Administered 2017-01-30 – 2017-01-31 (×2): 1 mg via ORAL
  Filled 2017-01-30 (×2): qty 1

## 2017-01-30 MED ORDER — VITAMIN B-1 100 MG PO TABS: 100.0000 mg | ORAL_TABLET | Freq: Every day | ORAL | Status: DC

## 2017-01-30 MED ORDER — HYDROCODONE-ACETAMINOPHEN 5-325 MG PO TABS
1.0000 | ORAL_TABLET | Freq: Once | ORAL | Status: AC
  Administered 2017-01-30: 1 via ORAL
  Filled 2017-01-30: qty 1

## 2017-01-30 MED ORDER — LORAZEPAM 2 MG/ML IJ SOLN: 1.0000 mg | Freq: Four times a day (QID) | INTRAMUSCULAR | Status: DC | PRN

## 2017-01-30 MED ORDER — TETANUS-DIPHTH-ACELL PERTUSSIS 5-2.5-18.5 LF-MCG/0.5 IM SUSP
0.5000 mL | Freq: Once | INTRAMUSCULAR | Status: AC
  Administered 2017-01-30: 0.5 mL via INTRAMUSCULAR
  Filled 2017-01-30: qty 0.5

## 2017-01-30 MED ORDER — LORAZEPAM 1 MG PO TABS
1.0000 mg | ORAL_TABLET | Freq: Once | ORAL | Status: AC
  Administered 2017-01-30: 1 mg via ORAL
  Filled 2017-01-30: qty 1

## 2017-01-30 MED ORDER — HYDROCODONE-ACETAMINOPHEN 5-325 MG PO TABS: 1.0000 | ORAL_TABLET | Freq: Four times a day (QID) | ORAL | 0 refills | Status: AC | PRN

## 2017-01-30 MED ORDER — LORAZEPAM 1 MG PO TABS: 1.0000 mg | ORAL_TABLET | Freq: Four times a day (QID) | ORAL | Status: DC | PRN

## 2017-01-30 MED ORDER — ACETAMINOPHEN 325 MG PO TABS
650.0000 mg | ORAL_TABLET | Freq: Four times a day (QID) | ORAL | Status: DC | PRN
  Administered 2017-01-30: 650 mg via ORAL
  Filled 2017-01-30: qty 2

## 2017-01-30 MED ORDER — ACETAMINOPHEN 650 MG RE SUPP: 650.0000 mg | Freq: Four times a day (QID) | RECTAL | Status: DC | PRN

## 2017-01-30 MED ORDER — THIAMINE HCL 100 MG/ML IJ SOLN
100.0000 mg | Freq: Every day | INTRAMUSCULAR | Status: DC
  Filled 2017-01-30: qty 2

## 2017-01-30 MED ORDER — ADULT MULTIVITAMIN W/MINERALS CH
1.0000 | ORAL_TABLET | Freq: Every day | ORAL | Status: DC
  Administered 2017-01-30 – 2017-01-31 (×2): 1 via ORAL
  Filled 2017-01-30 (×2): qty 1

## 2017-01-30 MED ORDER — HEPARIN SODIUM (PORCINE) 5000 UNIT/ML IJ SOLN
5000.0000 [IU] | Freq: Three times a day (TID) | INTRAMUSCULAR | Status: DC
  Administered 2017-01-30 – 2017-01-31 (×2): 5000 [IU] via SUBCUTANEOUS
  Filled 2017-01-30 (×2): qty 1

## 2017-01-30 MED ORDER — LIDOCAINE-EPINEPHRINE (PF) 2 %-1:200000 IJ SOLN
INTRAMUSCULAR | Status: AC
  Filled 2017-01-30: qty 20

## 2017-01-30 MED ORDER — ONDANSETRON HCL 4 MG/2ML IJ SOLN
4.0000 mg | Freq: Once | INTRAMUSCULAR | Status: AC
  Administered 2017-01-30: 4 mg via INTRAVENOUS
  Filled 2017-01-30: qty 2

## 2017-01-30 MED ORDER — SODIUM CHLORIDE 0.9 % IV BOLUS (SEPSIS)
1000.0000 mL | Freq: Once | INTRAVENOUS | Status: AC
  Administered 2017-01-30: 1000 mL via INTRAVENOUS

## 2017-01-30 MED ORDER — THIAMINE HCL 100 MG/ML IJ SOLN: 100.0000 mg | Freq: Every day | INTRAMUSCULAR | Status: DC

## 2017-01-30 MED ORDER — CEPHALEXIN 500 MG PO CAPS
500.0000 mg | ORAL_CAPSULE | Freq: Once | ORAL | Status: AC
  Administered 2017-01-30: 500 mg via ORAL
  Filled 2017-01-30: qty 1

## 2017-01-30 MED ORDER — LIDOCAINE HCL (PF) 1 % IJ SOLN
INTRAMUSCULAR | Status: AC
  Filled 2017-01-30: qty 5

## 2017-01-30 MED ORDER — LORAZEPAM 1 MG PO TABS: 0.0000 mg | ORAL_TABLET | Freq: Two times a day (BID) | ORAL | Status: DC

## 2017-01-30 MED ORDER — SODIUM CHLORIDE 0.9 % IV BOLUS (SEPSIS)
1000.0000 mL | Freq: Once | INTRAVENOUS | Status: AC
Start: 2017-01-30 — End: 2017-01-30
  Administered 2017-01-30: 1000 mL via INTRAVENOUS

## 2017-01-30 MED ORDER — CEPHALEXIN 500 MG PO CAPS: 500.0000 mg | ORAL_CAPSULE | Freq: Two times a day (BID) | ORAL | 0 refills | Status: AC

## 2017-01-30 MED ORDER — FOLIC ACID 1 MG PO TABS
1.0000 mg | ORAL_TABLET | Freq: Every day | ORAL | Status: DC
  Administered 2017-01-30 – 2017-01-31 (×2): 1 mg via ORAL
  Filled 2017-01-30 (×2): qty 1

## 2017-01-30 NOTE — ED Notes (Signed)
[facility] notified of CIWA score at this time.
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
## 2017-01-30 NOTE — ED Notes (Addendum)
Attempted to ambulate pt in hall, pt became very dizzy upon standing and was "seeing spots" in her eyes.  Pt sat in recliner and bed linens changed, room cleaned up and pt placed back in bed with warm blanket. Dr. Bebe ShaggyWickline notified and has given order for fluid bolus. Also gave pt meal tray, pt is currently trying to eat.

## 2017-01-30 NOTE — ED Notes (Signed)
[facility]

## 2017-01-30 NOTE — ED Notes (Signed)
Attempted to walk pt, orthostatics performed. Pt is unable to stand steadily and feels as though she is going to fall. [place] notified.

## 2017-01-30 NOTE — ED Notes (Signed)
Pt given lunch tray at this time
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
## 2017-01-30 NOTE — ED Notes (Signed)
Pt states she feels very dizzy when getting up and is scared to go home by herself. Minimal breakfast ate, pt given a snack and told we will ambulate and see how well she walks.

## 2017-01-30 NOTE — ED Provider Notes (Signed)
Pt sitting in ED from previous shift after being discharged with plane to finish IVF, take PO, ambulate and find a ride home. Pt unable to stand/walk due to unsteadiness. CIWA 6, so ativan given. Pt is not orthostatic and VS remain stable. Pt continues unable to stand. Unsafe for d/c home alone.  T/C to [facility] Dr. Rinaldo RatelKadolph, case discussed, including:  HPI, pertinent PM/SHx, VS/PE, dx testing, ED course and treatment:  Agreeable to admit.    Samuel JesterMcManus, Margee Trentham, DO 01/30/17 1441

## 2017-01-30 NOTE — ED Notes (Signed)
Attempted to get pt up to ambulate, pt began shaking and feeling light headed, unable to ambulate. BP was 149/96. MD Clarene DukeMcManus notified. Pt reports she drinks a 24oz beer after work, denies heavy ETOH use.
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
## 2017-01-31 LAB — BASIC METABOLIC PANEL
Anion gap: 5 (ref 5–15)
BUN: 7 mg/dL (ref 6–20)
CALCIUM: 8.4 mg/dL — AB (ref 8.9–10.3)
CHLORIDE: 108 mmol/L (ref 101–111)
CO2: 25 mmol/L (ref 22–32)
CREATININE: 0.53 mg/dL (ref 0.44–1.00)
Glucose, Bld: 92 mg/dL (ref 65–99)
Potassium: 3.8 mmol/L (ref 3.5–5.1)
SODIUM: 138 mmol/L (ref 135–145)

## 2017-01-31 LAB — HEMOGLOBIN AND HEMATOCRIT, BLOOD
HEMATOCRIT: 28.4 % — AB (ref 36.0–46.0)
HEMOGLOBIN: 10.1 g/dL — AB (ref 12.0–15.0)

## 2017-01-31 LAB — HIV ANTIBODY (ROUTINE TESTING W REFLEX): HIV SCREEN 4TH GENERATION: NONREACTIVE

## 2017-01-31 MED ORDER — MECLIZINE HCL 25 MG PO TABS: 25.0000 mg | ORAL_TABLET | Freq: Three times a day (TID) | ORAL | 0 refills | Status: AC | PRN

## 2017-01-31 MED ORDER — ADULT MULTIVITAMIN W/MINERALS CH: 1.0000 | ORAL_TABLET | Freq: Every day | ORAL | 0 refills | Status: AC

## 2017-01-31 MED ORDER — SODIUM CHLORIDE 0.9 % IV BOLUS (SEPSIS)
1000.0000 mL | Freq: Once | INTRAVENOUS | Status: AC
  Administered 2017-01-31: 1000 mL via INTRAVENOUS

## 2017-01-31 MED ORDER — MECLIZINE HCL 12.5 MG PO TABS
25.0000 mg | ORAL_TABLET | Freq: Three times a day (TID) | ORAL | Status: DC | PRN
  Administered 2017-01-31: 25 mg via ORAL
  Filled 2017-01-31: qty 2
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
## 2017-01-31 NOTE — Progress Notes (Signed)
Patient being discharge home with instructions. IV cath removed and intact. No c/o pain at this time or at site.

## 2017-01-31 NOTE — Progress Notes (Signed)
Pt refused dressing changes. States she wants them changed when she gets cleaned up later in the day.
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
## 2020-07-11 ENCOUNTER — Ambulatory Visit: Payer: Self-pay | Admitting: *Deleted

## 2020-07-11 NOTE — Telephone Encounter (Signed)
Patient called in to say that she woke up with swelling on the right side of her face under her chin area. Went to bed with a small knot by the ear area but gone no pain but visible Ph# [phone]   Call to patient - she reports she has swelling this morning into R chin/neck area. Knot in area about the size of pecan/grape size. Not sore or itching. Advised UC for evaluation- she is concerned about cost and she is going to call them to see- advised patient tylenol, heat, antihistamine for home treatment.  Reason for Disposition . [1] Small swelling or lump AND [2] unexplained AND [3] present < 1 week  Answer Assessment - Initial Assessment Questions 1. APPEARANCE of SWELLING: "What does it look like?" (e.g., lymph node, insect bite, mole)     Swelling goes beyond the knot- chin to neck-R side 2. SIZE: "How large is the swelling?" (inches, cm or compare to coins)     Knot- pecan size 3. LOCATION: "Where is the swelling located?"     R chin/neck 4. ONSET: "When did the swelling start?"    Last night- 11:30 5. PAIN: "Is it painful?" If Yes, ask: "How much?"     No pain 6. ITCH: "Does it itch?" If Yes, ask: "How much?"     No itching 7. CAUSE: "What do you think caused the swelling?"     No idea 8. OTHER SYMPTOMS: "Do you have any other symptoms?" (e.g., fever)     No other symptoms  Protocols used: SKIN LUMP OR LOCALIZED SWELLING-A-AH

## 2020-08-25 ENCOUNTER — Emergency Department (HOSPITAL_COMMUNITY)
Admission: EM | Admit: 2020-08-25 | Discharge: 2020-08-25 | Disposition: A | Discharge status: Home / Self Care | Payer: Self-pay | Attending: Emergency Medicine | Admitting: Emergency Medicine

## 2020-08-25 DIAGNOSIS — Z5321 Procedure and treatment not carried out due to patient leaving prior to being seen by health care provider: Secondary | ICD-10-CM | POA: Insufficient documentation

## 2020-08-25 DIAGNOSIS — R0602 Shortness of breath: Secondary | ICD-10-CM | POA: Insufficient documentation

## 2020-08-25 DIAGNOSIS — W07XXXA Fall from chair, initial encounter: Secondary | ICD-10-CM | POA: Insufficient documentation

## 2020-08-25 NOTE — ED Triage Notes (Signed)
Pt at ED to see another Pt and began to get short of breath, trembling, staring into space and falling out of her chair. Staff helped pt to stay in chair and got pt into a w/c to be admitted and assessed.

## 2020-08-25 NOTE — ED Notes (Signed)
Pt signed herself out AMA.
